# Patient Record
Sex: Male | Born: 1990 | Race: White | Hispanic: Yes | Marital: Single | Smoking: Former smoker
Health system: Southern US, Community
[De-identification: ages and names within clinical notes are randomized; demographics above are authoritative.]

---

## 2018-12-10 ENCOUNTER — Encounter (HOSPITAL_COMMUNITY): Payer: Self-pay | Admitting: Emergency Medicine

## 2018-12-10 ENCOUNTER — Emergency Department (HOSPITAL_COMMUNITY): Payer: HRSA Program

## 2018-12-10 ENCOUNTER — Emergency Department (HOSPITAL_COMMUNITY)
Admission: EM | Admit: 2018-12-10 | Discharge: 2018-12-10 | Disposition: A | Discharge status: Home / Self Care | Payer: HRSA Program | Attending: Emergency Medicine | Admitting: Emergency Medicine

## 2018-12-10 ENCOUNTER — Other Ambulatory Visit: Payer: Self-pay

## 2018-12-10 DIAGNOSIS — Z20828 Contact with and (suspected) exposure to other viral communicable diseases: Secondary | ICD-10-CM | POA: Diagnosis not present

## 2018-12-10 DIAGNOSIS — Z87891 Personal history of nicotine dependence: Secondary | ICD-10-CM | POA: Diagnosis not present

## 2018-12-10 DIAGNOSIS — R509 Fever, unspecified: Secondary | ICD-10-CM | POA: Diagnosis not present

## 2018-12-10 NOTE — Discharge Instructions (Signed)
You can take Tylenol or Ibuprofen as directed for pain. You can alternate Tylenol and Ibuprofen every 4 hours. If you take Tylenol at 1pm, then you can take Ibuprofen at 5pm. Then you can take Tylenol again at 9pm.   Make sure you are getting plenty of rest and fluids.  Return to the emergency department for any trouble breathing, vomiting, chest pain or any other worsening or concerning symptoms.  As we discussed, you have been tested for COVID-19.  The results may take 2 to 3 days to come back.  If you are positive, you will be notified.  He can we check online to see your results.  As we discussed, until your test results come back, you should self isolate and quarantine yourself.  You should stay at home.     Person Under Monitoring Name: Steven BusingJessie Banks  Location[place] [address] [place] [id number]   Infection Prevention Recommendations for Individuals Confirmed to have, or Being Evaluated for, 2019 Novel Coronavirus (COVID-19) Infection Who Receive Care at Home  Individuals who are confirmed to have, or are being evaluated for, COVID-19 should follow the prevention steps below until a healthcare provider or local or state health department says they can return to normal activities.  Stay home except to get medical care You should restrict activities outside your home, except for getting medical care. Do not go to work, school, or public areas, and do not use public transportation or taxis.  Call ahead before visiting your doctor Before your medical appointment, call the healthcare provider and tell them that you have, or are being evaluated for, COVID-19 infection. This will help the healthcare providers office take steps to keep other people from getting infected. Ask your healthcare provider to call the local or state health department.  Monitor your symptoms Seek prompt medical attention if your illness is worsening (e.g., difficulty breathing). Before going to your  medical appointment, call the healthcare provider and tell them that you have, or are being evaluated for, COVID-19 infection. Ask your healthcare provider to call the local or state health department.  Wear a facemask You should wear a facemask that covers your nose and mouth when you are in the same room with other people and when you visit a healthcare provider. People who live with or visit you should also wear a facemask while they are in the same room with you.  Separate yourself from other people in your home As much as possible, you should stay in a different room from other people in your home. Also, you should use a separate bathroom, if available.  Avoid sharing household items You should not share dishes, drinking glasses, cups, eating utensils, towels, bedding, or other items with other people in your home. After using these items, you should wash them thoroughly with soap and water.  Cover your coughs and sneezes Cover your mouth and nose with a tissue when you cough or sneeze, or you can cough or sneeze into your sleeve. Throw used tissues in a lined trash can, and immediately wash your hands with soap and water for at least 20 seconds or use an alcohol-based hand rub.  Wash your [employer] Wash your hands often and thoroughly with soap and water for at least 20 seconds. You can use an alcohol-based hand sanitizer if soap and water are not available and if your hands are not visibly dirty. Avoid touching your eyes, nose, and mouth with unwashed hands.   Prevention Steps for Caregivers and  Household Members of Individuals Confirmed to have, or Being Evaluated for, COVID-19 Infection Being Cared for in the Home  If you live with, or provide care at home for, a person confirmed to have, or being evaluated for, COVID-19 infection please follow these guidelines to prevent infection:  Follow healthcare providers instructions Make sure that you understand and can help the  patient follow any healthcare provider instructions for all care.  Provide for the patients basic needs You should help the patient with basic needs in the home and provide support for getting groceries, prescriptions, and other personal needs.  Monitor the patients symptoms If they are getting sicker, call his or her medical provider and tell them that the patient has, or is being evaluated for, COVID-19 infection. This will help the healthcare providers office take steps to keep other people from getting infected. Ask the healthcare provider to call the local or state health department.  Limit the number of people who have contact with the patient  If possible, have only one caregiver for the patient.  Other household members should stay in another home or place of residence. If this is not possible, they should stay  in another room, or be separated from the patient as much as possible. Use a separate bathroom, if available.  Restrict visitors who do not have an essential need to be in the home.  Keep older adults, very young children, and other sick people away from the patient Keep older adults, very young children, and those who have compromised immune systems or chronic health conditions away from the patient. This includes people with chronic heart, lung, or kidney conditions, diabetes, and cancer.  Ensure good ventilation Make sure that shared spaces in the home have good air flow, such as from an air conditioner or an opened window, weather permitting.  Wash your hands often  Wash your hands often and thoroughly with soap and water for at least 20 seconds. You can use an alcohol based hand sanitizer if soap and water are not available and if your hands are not visibly dirty.  Avoid touching your eyes, nose, and mouth with unwashed hands.  Use disposable paper towels to dry your hands. If not available, use dedicated cloth towels and replace them when they become  wet.  Wear a facemask and gloves  Wear a disposable facemask at all times in the room and gloves when you touch or have contact with the patients blood, body fluids, and/or secretions or excretions, such as sweat, saliva, sputum, nasal mucus, vomit, urine, or feces.  Ensure the mask fits over your nose and mouth tightly, and do not touch it during use.  Throw out disposable facemasks and gloves after using them. Do not reuse.  Wash your hands immediately after removing your facemask and gloves.  If your personal clothing becomes contaminated, carefully remove clothing and launder. Wash your hands after handling contaminated clothing.  Place all used disposable facemasks, gloves, and other waste in a lined container before disposing them with other household waste.  Remove gloves and wash your hands immediately after handling these items.  Do not share dishes, glasses, or other household items with the patient  Avoid sharing household items. You should not share dishes, drinking glasses, cups, eating utensils, towels, bedding, or other items with a patient who is confirmed to have, or being evaluated for, COVID-19 infection.  After the person uses these items, you should wash them thoroughly with soap and water.  Wash laundry thoroughly  Immediately remove and wash clothes or bedding that have blood, body fluids, and/or secretions or excretions, such as sweat, saliva, sputum, nasal mucus, vomit, urine, or feces, on them.  Wear gloves when handling laundry from the patient.  Read and follow directions on labels of laundry or clothing items and detergent. In general, wash and dry with the warmest temperatures recommended on the label.  Clean all areas the individual has used often  Clean all touchable surfaces, such as counters, tabletops, doorknobs, bathroom fixtures, toilets, phones, keyboards, tablets, and bedside tables, every day. Also, clean any surfaces that may have blood, body  fluids, and/or secretions or excretions on them.  Wear gloves when cleaning surfaces the patient has come in contact with.  Use a diluted bleach solution (e.g., dilute bleach with 1 part bleach and 10 parts water) or a household disinfectant with a label that says EPA-registered for coronaviruses. To make a bleach solution at home, add 1 tablespoon of bleach to 1 quart (4 cups) of water. For a larger supply, add  cup of bleach to 1 gallon (16 cups) of water.  Read labels of cleaning products and follow recommendations provided on product labels. Labels contain instructions for safe and effective use of the cleaning product including precautions you should take when applying the product, such as wearing gloves or eye protection and making sure you have good ventilation during use of the product.  Remove gloves and wash hands immediately after cleaning.  Monitor yourself for signs and symptoms of illness Caregivers and household members are considered close contacts, should monitor their health, and will be asked to limit movement outside of the home to the extent possible. Follow the monitoring steps for close contacts listed on the symptom monitoring form.   ? If you have additional questions, contact your local health department or call the epidemiologist on call at [phone] (available 24/7). ? This guidance is subject to change. For the most up-to-date guidance from [facility], please refer to their website: [email or web address]

## 2018-12-10 NOTE — ED Triage Notes (Addendum)
Pt c/o fever up to 101 at home, nausea, dry cough onset yesterday. +HA, denies sore throat. Generalized body aches, pt states he was at graduation party last week where several members of the family have tested positive for COVID.

## 2018-12-10 NOTE — ED Provider Notes (Signed)
[facility] Provider Note   CSN: [id number] Arrival date & time: 12/10/18  1920    History   Chief Complaint Chief Complaint  Patient presents with  . Fever    HPI Steven Banks is a 28 y.o. male with no significant past medical history who presents for evaluation of fever, nausea, generalized myalgia, cough, headache is been ongoing for last 2 days.  Patient reports at home, he has measured a fever of 100.4.  He states that he is also had a dry cough.  No hemoptysis noted.  He has not any trouble breathing.  He denies any history of COPD or asthma.  He also reports a generalized headache.  He denies any numbness/weakness of his arms or legs.  He has not taken any medications for the fever.  Patient states he feels "achy and tired all over."  He reports that he attended a graduation party last week and noted that afterwards, several members of the family tested positive for COVID.  Patient denies any chest pain, abdominal pain, vomiting, urinary complaints.      The history is provided by the patient.    History reviewed. No pertinent past medical history.  There are no active problems to display for this patient.   History reviewed. No pertinent surgical history.      Home Medications    Prior to Admission medications   Not on File    Family History No family history on file.  Social History Social History   Tobacco Use  . Smoking status: Former Games developermoker  . Smokeless tobacco: Never Used  Substance Use Topics  . Alcohol use: Yes  . Drug use: Not Currently    Types: Marijuana     Allergies   Patient has no known allergies.   Review of Systems Review of Systems  Constitutional: Positive for fatigue and fever.  Eyes: Negative for visual disturbance.  Respiratory: Positive for cough. Negative for shortness of breath.   Cardiovascular: Negative for chest pain.  Gastrointestinal: Negative for abdominal pain, nausea and  vomiting.  Genitourinary: Negative for dysuria and hematuria.  Neurological: Positive for headaches. Negative for weakness and numbness.  All other systems reviewed and are negative.    Physical Exam Updated Vital Signs BP 122/81   Pulse 65   Temp 99 F (37.2 C) (Oral)   Resp 16   Ht 5\' 7"  (1.702 m)   Wt 80 kg   SpO2 98%   BMI 27.62 kg/m   Physical Exam Vitals signs and nursing note reviewed.  Constitutional:      Appearance: Normal appearance. He is well-developed.  HENT:     Head: Normocephalic and atraumatic.  Eyes:     General: Lids are normal.     Conjunctiva/sclera: Conjunctivae normal.     Pupils: Pupils are equal, round, and reactive to light.     Comments: PERRL. EOMs intact.   Neck:     Musculoskeletal: Full passive range of motion without pain.     Comments: Full flexion/extension and lateral movement of neck fully intact. No bony midline tenderness. No deformities or crepitus.  Neck is supple and without rigidity. Cardiovascular:     Rate and Rhythm: Normal rate and regular rhythm.     Pulses: Normal pulses.     Heart sounds: Normal heart sounds. No murmur. No friction rub. No gallop.   Pulmonary:     Effort: Pulmonary effort is normal.     Breath sounds: Normal breath  sounds.     Comments: Lungs clear to auscultation bilaterally.  Symmetric chest rise.  No wheezing, rales, rhonchi. Abdominal:     Palpations: Abdomen is soft. Abdomen is not rigid.     Tenderness: There is no abdominal tenderness. There is no guarding.     Comments: Abdomen is soft, non-distended, non-tender. No rigidity, No guarding. No peritoneal signs.  Musculoskeletal: Normal range of motion.  Skin:    General: Skin is warm and dry.     Capillary Refill: Capillary refill takes less than 2 seconds.  Neurological:     Mental Status: He is alert and oriented to person, place, and time.     Comments: Cranial nerves III-XII intact Follows commands, Moves all extremities  5/5 strength to  BUE and BLE  Sensation intact throughout all major nerve distributions Normal coordination No gait abnormalities  No slurred speech. No facial droop.   Psychiatric:        Speech: Speech normal.      ED Treatments / Results  Labs (all labs ordered are listed, but only abnormal results are displayed) Labs Reviewed  NOVEL CORONAVIRUS, NAA (HOSPITAL ORDER, SEND-OUT TO REF LAB)    EKG None  Radiology Dg Chest Portable 1 View  Result Date: 12/10/2018 CLINICAL DATA:  Cough EXAM: PORTABLE CHEST 1 VIEW COMPARISON:  None. FINDINGS: The heart size and mediastinal contours are within normal limits. Both lungs are clear. The visualized skeletal structures are unremarkable. IMPRESSION: No active disease. Electronically Signed   By: Donavan Foil M.D.   On: 12/10/2018 21:29    Procedures Procedures (including critical care time)  Medications Ordered in ED Medications - No data to display   Initial Impression / Assessment and Plan / ED Course  I have reviewed the triage vital signs and the nursing notes.  Pertinent labs & imaging results that were available during my care of the patient were reviewed by me and considered in my medical decision making (see chart for details).        28 year old male with no significant past medical history who presents for evaluation of 2 days of fevers, generalized body aches, cough, headache.  Reports that several members of his family attended a graduation party and they now tested positive for COVID.  No history of asthma or COPD. Patient is afebrile, non-toxic appearing, sitting comfortably on examination table. Vital signs reviewed and stable.  Clear to auscultation exam.  No meningismal signs.  No neuro deficits.  Consider URI versus COVID-19.  Will plan for chest x-ray, COVID testing.  Chest x-ray negative for any acute abnormality.  Vitals stable.  No evidence of hypoxia.  Patient stable for discharge at this time.  I discussed with patient that  his COVID test results will come back in the next 24 to 48 hours.  Advised him to self quarantine and isolate himself until results come back.  Encourage at home supportive care measures. At this time, patient exhibits no emergent life-threatening condition that require further evaluation in ED or admission. Patient had ample opportunity for questions and discussion. All patient's questions were answered with full understanding. Strict return precautions discussed. Patient expresses understanding and agreement to plan.    Steven Banks was evaluated in Emergency Department on 12/10/2018 for the symptoms described in the history of present illness. He was evaluated in the context of the global COVID-19 pandemic, which necessitated consideration that the patient might be at risk for infection with the SARS-CoV-2 virus that causes COVID-19. Institutional protocols  and algorithms that pertain to the evaluation of patients at risk for COVID-19 are in a state of rapid change based on information released by regulatory bodies including the CDC and federal and state organizations. These policies and algorithms were followed during the patient's care in the ED.  Portions of this note were generated with Scientist, clinical (histocompatibility and immunogenetics)Dragon dictation software. Dictation errors may occur despite best attempts at proofreading.  Final Clinical Impressions(s) / ED Diagnoses   Final diagnoses:  Febrile illness    ED Discharge Orders    None       Rosana HoesLayden, Yitzchok Carriger A, PA-C 12/10/18 2251    Alvira MondaySchlossman, Erin, MD 12/12/18 1456

## 2018-12-15 LAB — NOVEL CORONAVIRUS, NAA (HOSP ORDER, SEND-OUT TO REF LAB; TAT 18-24 HRS): SARS-CoV-2, NAA: NOT DETECTED

## 2019-11-04 IMAGING — DX PORTABLE CHEST - 1 VIEW
1 series · 1 of 1 positions shown · non-contrast
Comparison: None.

CLINICAL DATA: Cough

EXAM:
PORTABLE CHEST 1 VIEW

[chest ap]
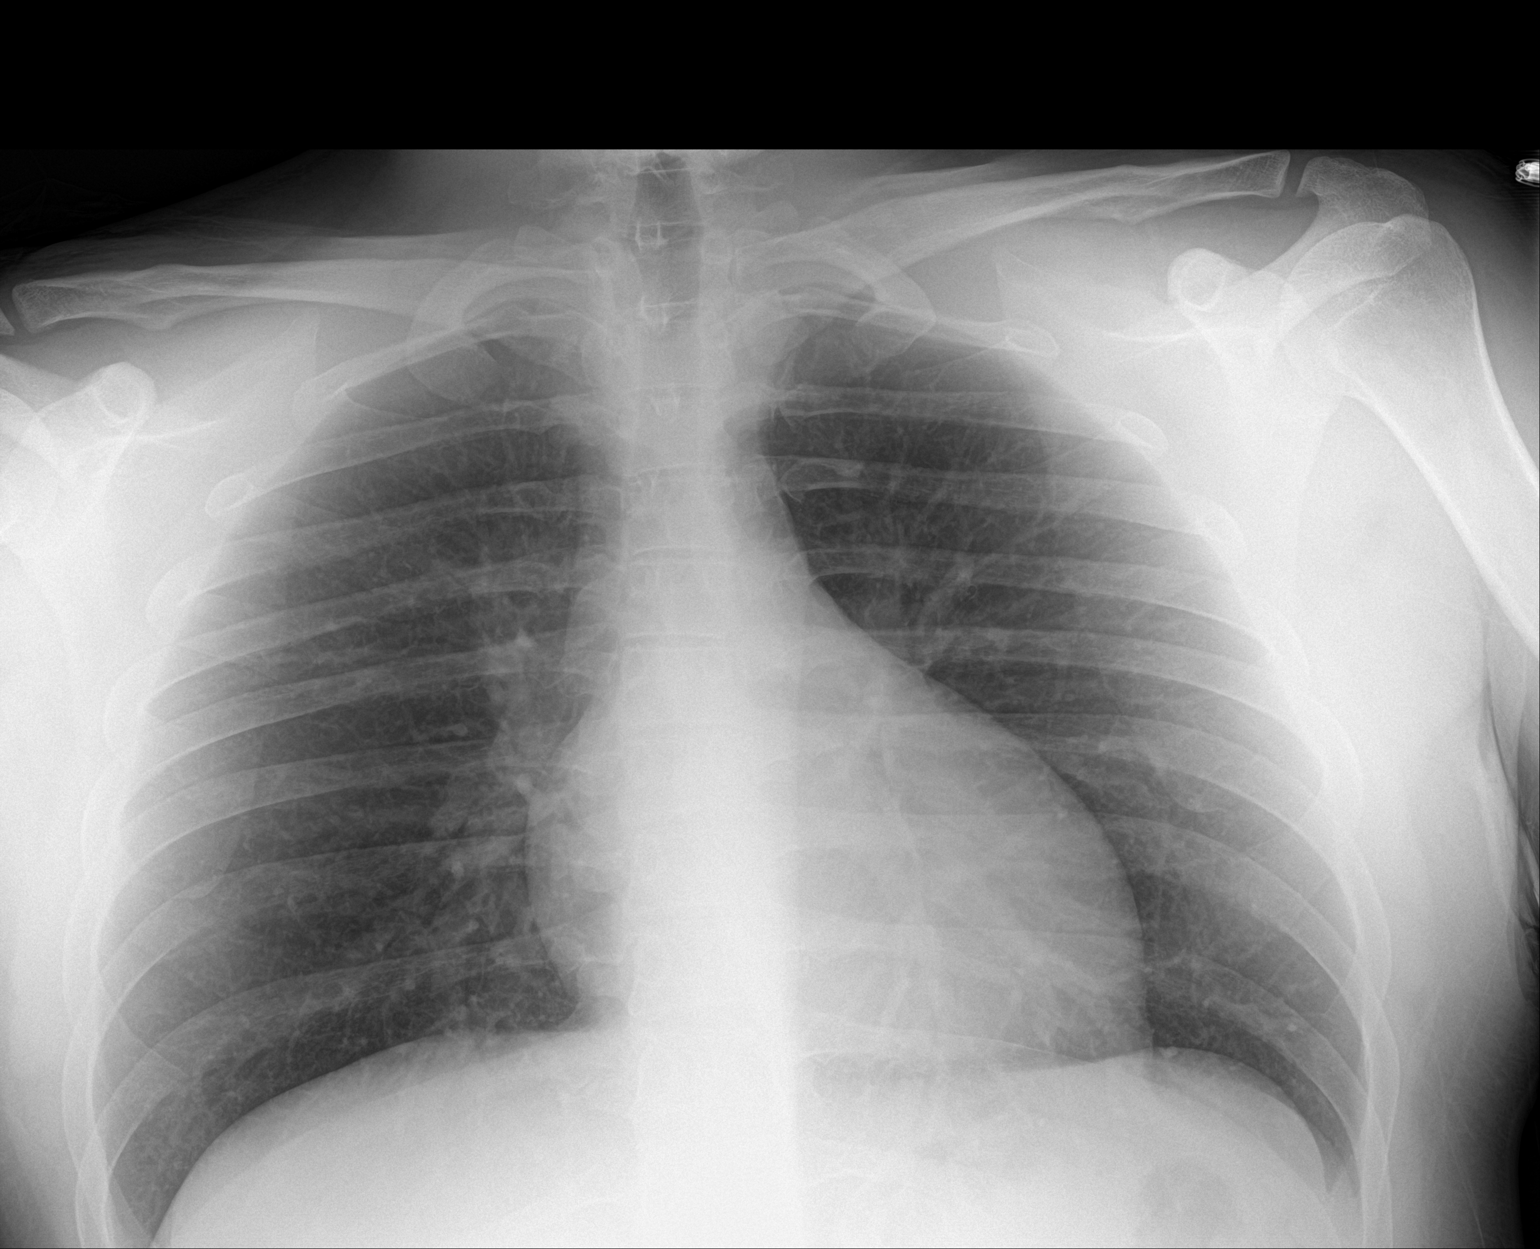

[1 of 1 positions shown; findings below may reference images not displayed]

FINDINGS: The heart size and mediastinal contours are within normal limits.
Both lungs are clear. The visualized skeletal structures are
unremarkable.
IMPRESSION: No active disease.
